# Patient Record
Sex: Male | Born: 1990 | Race: Black or African American | Hispanic: No | Marital: Single | Smoking: Current every day smoker
Health system: Southern US, Community
[De-identification: ages and names within clinical notes are randomized; demographics above are authoritative.]

## PROBLEM LIST (undated history)

## (undated) HISTORY — PX: HERNIA REPAIR: SHX51

---

## 2011-04-09 ENCOUNTER — Encounter (HOSPITAL_COMMUNITY): Payer: Self-pay | Admitting: Emergency Medicine

## 2011-04-09 ENCOUNTER — Emergency Department (HOSPITAL_COMMUNITY)
Admission: EM | Admit: 2011-04-09 | Discharge: 2011-04-09 | Disposition: A | Discharge status: Home / Self Care | Payer: Self-pay | Attending: Emergency Medicine | Admitting: Emergency Medicine

## 2011-04-09 DIAGNOSIS — G8918 Other acute postprocedural pain: Secondary | ICD-10-CM | POA: Insufficient documentation

## 2011-04-09 MED ORDER — OXYCODONE-ACETAMINOPHEN 5-325 MG PO TABS: 1.0000 | ORAL_TABLET | Freq: Four times a day (QID) | ORAL | Status: AC | PRN

## 2011-04-09 NOTE — ED Provider Notes (Signed)
History     CSN: [id number]  Arrival date & time 04/09/11  1337   First MD Initiated Contact with Patient 04/09/11 1502      Chief Complaint  Patient presents with  . Abdominal Pain   patient states he had [facility], Dr. Doylene Canning?  He reports intermittent, persistent pain and bulging in his right inguinal area. He has had no fevers, no nausea or vomiting. The pain is intermittent. He's had no testicular pain or swelling. Patient denies any significant pain at this time. He has had no back pain. No perineal pain. No difficulty with bowels or bladder. Patient states he does have a Careers adviser number, but did not contact them.  (Consider location/radiation/quality/duration/timing/severity/associated sxs/prior treatment) HPI  History reviewed. No pertinent past medical history.  Past Surgical History  Procedure Date  . Hernia repair     No family history on file.  History  Substance Use Topics  . Smoking status: Never Smoker   . Smokeless tobacco: Not on file  . Alcohol Use: No      Review of Systems  All other systems reviewed and are negative.   Patient is seen and examined, initial history and physical is completed. Evaluation initiated Allergies  Review of patient's allergies indicates no known allergies.  Home Medications  No current outpatient prescriptions on file.  BP 125/78  Pulse 79  Temp(Src) 98.4 F (36.9 C) (Oral)  Resp 20  SpO2 100%  Physical Exam  Nursing note and vitals reviewed. Constitutional: He is oriented to person, place, and time. He appears well-developed and well-nourished.  HENT:  Head: Normocephalic and atraumatic.  Eyes: Conjunctivae and EOM are normal. Pupils are equal, round, and reactive to light.  Neck: Neck supple.  Cardiovascular: Normal rate and regular rhythm.  Exam reveals no gallop and no friction rub.   No murmur heard. Pulmonary/Chest: Breath sounds normal. He has no  wheezes. He has no rales. He exhibits no tenderness.  Abdominal: Soft. Bowel sounds are normal. He exhibits no distension. There is no tenderness. There is no rebound and no guarding.  Genitourinary:       Right inguinal area is examined. Scar is intact. There is no significant bulging, no deformity, minimal tenderness to palpation over the scar. No redness. When patient stood up and bear down and there was no increase in the swelling. No signs of incarceration.  Musculoskeletal: Normal range of motion.  Neurological: He is alert and oriented to person, place, and time. No cranial nerve deficit. Coordination normal.  Skin: Skin is warm and dry. No rash noted.  Psychiatric: He has a normal mood and affect.    ED Course  Procedures (including critical care time)  Labs Reviewed - No data to display No results found.   No diagnosis found.   Patient is seen and examined, initial history and physical is completed. Evaluation initiated MDM  Plan: The patient's pain is more consistent with postoperative pain. There are no signs of acute strangulation at this time. Vital signs are normal. Abdomen is completely benign. No other systemic symptoms. Patient was told that he would need to contact his general surgeon to discuss further matters as an outpatient. He is also referred to our general surgeons here in [place]. We'll provide pain medications and instructions for followup.   He was told to return to the ER for any increasing pain, fevers, vomiting, testicular pain or any other concerning symptoms. Otherwise, will be stable for  outpatient followup        Shandrea Lusk A. Patrica Duel, MD 04/09/11 1515

## 2011-04-09 NOTE — ED Notes (Signed)
Dec 2011 hernia surgery states having been having intermittent pain since the surgery.  Concerned about pain and currently 8-9/10 achy dull sharp. Denies nausea vomiting or diarrhea.

## 2011-04-09 NOTE — Discharge Instructions (Signed)
Hernia A hernia happens when an organ inside your body pushes out through a weak spot in your belly (abdominal) wall. Most hernias get worse over time. They can often be pushed back into place (reduced). Surgery may be needed to repair hernias that cannot be pushed into place. HOME CARE  Keep doing normal activities.   Avoid lifting more than 10 pounds (4.5 kilograms).   Cough gently and avoid straining. Over time, these things will:   Increase your hernia size.   Irritate your hernia.   Break down hernia repairs.   Stop smoking.   Do not wear anything tight over your hernia. Do not keep the hernia in with an outside bandage.   Eat food that is high in fiber (fruit, vegetables, whole grains).   Drink enough fluids to keep your pee (urine) clear or pale yellow.   Take medicines to make your poop soft (stool softeners) if you cannot poop (constipated).  GET HELP RIGHT AWAY IF:   You have a fever.   You have belly pain that gets worse.   You feel sick to your stomach (nauseous) and throw up (vomit).   Your skin starts to bulge out.   Your hernia turns a different color, feels hard, or is tender.   You have increased pain or puffiness (swelling) around the hernia.   You poop more or less often.   Your poop does not look the way normally does.   You have watery poop (diarrhea).   You cannot push the hernia back in place by applying gentle pressure while lying down.  MAKE SURE YOU:   Understand these instructions.   Will watch your condition.   Will get help right away if you are not doing well or get worse.  Document Released: 06/24/2009 Document Revised: 12/24/2010 Document Reviewed: 06/24/2009 ExitCare Patient Information 2012 [employer]. 

## 2011-07-02 ENCOUNTER — Emergency Department (HOSPITAL_COMMUNITY)
Admission: EM | Admit: 2011-07-02 | Discharge: 2011-07-02 | Disposition: A | Discharge status: Home / Self Care | Payer: Self-pay | Attending: Emergency Medicine | Admitting: Emergency Medicine

## 2011-07-02 ENCOUNTER — Encounter (HOSPITAL_COMMUNITY): Payer: Self-pay | Admitting: Emergency Medicine

## 2011-07-02 DIAGNOSIS — R300 Dysuria: Secondary | ICD-10-CM | POA: Insufficient documentation

## 2011-07-02 DIAGNOSIS — F17200 Nicotine dependence, unspecified, uncomplicated: Secondary | ICD-10-CM | POA: Insufficient documentation

## 2011-07-02 LAB — URINALYSIS, ROUTINE W REFLEX MICROSCOPIC
Bilirubin Urine: NEGATIVE
Hgb urine dipstick: NEGATIVE
Ketones, ur: NEGATIVE mg/dL
Nitrite: NEGATIVE
Specific Gravity, Urine: 1.025 (ref 1.005–1.030)
Urobilinogen, UA: 1 mg/dL (ref 0.0–1.0)
pH: 6.5 (ref 5.0–8.0)

## 2011-07-02 MED ORDER — LIDOCAINE HCL (PF) 1 % IJ SOLN
INTRAMUSCULAR | Status: AC
  Filled 2011-07-02: qty 5

## 2011-07-02 MED ORDER — CEFTRIAXONE SODIUM 250 MG IJ SOLR
250.0000 mg | INTRAMUSCULAR | Status: DC
  Administered 2011-07-02: 250 mg via INTRAMUSCULAR
  Filled 2011-07-02: qty 250

## 2011-07-02 MED ORDER — AZITHROMYCIN 250 MG PO TABS
1000.0000 mg | ORAL_TABLET | Freq: Once | ORAL | Status: AC
  Administered 2011-07-02: 1000 mg via ORAL
  Filled 2011-07-02: qty 4

## 2011-07-02 NOTE — ED Notes (Signed)
Pt c/o burning with urination x 2 days since taking supplement; pt denies penile discharge but just sts burning; pt poor historian

## 2011-07-02 NOTE — ED Provider Notes (Signed)
Medical screening examination/treatment/procedure(s) were performed by non-physician practitioner and as supervising physician I was immediately available for consultation/collaboration.   Loren Racer, MD 07/02/11 [phone]

## 2011-07-02 NOTE — ED Provider Notes (Signed)
History     CSN: [id number]  Arrival date & time 07/02/11  0740   First MD Initiated Contact with Patient 07/02/11 [phone]      Chief Complaint  Patient presents with  . Dysuria    (Consider location/radiation/quality/duration/timing/severity/associated sxs/prior treatment) HPI Comments: Patient presents with history of 2 days of dysuria. Symptoms coincided with the patient beginning to take an exercise supplement called Jack3D. Patient denies urethral discharge, fever, nausea vomiting. Patient states she essentially active and was last checked approximately 3 weeks ago. He states that he uses protection. Patient also took a pill that a friend gave him for urinary tract infection. He noted that it changed the color of his urine. Nothing makes the symptoms better. Onset was acute. Course is intermittent with urination.   Patient is a 21 y.o. male presenting with dysuria. The history is provided by the patient.  Dysuria  This is a new problem. The current episode started 2 days ago. The problem occurs every urination. The problem has not changed since onset.The quality of the pain is described as burning. The pain is mild. There has been no fever. Pertinent negatives include no nausea, no vomiting, no discharge, no frequency, no hematuria, no hesitancy, no urgency and no flank pain. Treatments tried: pill that turned urine orange.    History reviewed. No pertinent past medical history.  Past Surgical History  Procedure Date  . Hernia repair     History reviewed. No pertinent family history.  History  Substance Use Topics  . Smoking status: Current Everyday Smoker  . Smokeless tobacco: Not on file  . Alcohol Use: Yes      Review of Systems  Constitutional: Negative for fever.  HENT: Negative for sore throat.   Eyes: Negative for discharge and redness.  Gastrointestinal: Negative for nausea, vomiting, abdominal pain, diarrhea and rectal pain.  Genitourinary: Positive for  dysuria. Negative for hesitancy, urgency, frequency, hematuria, flank pain, discharge, genital sores, penile pain and testicular pain.  Musculoskeletal: Negative for myalgias and arthralgias.  Skin: Negative for rash.  Neurological: Negative for headaches.  Hematological: Negative for adenopathy.    Allergies  Review of patient's allergies indicates no known allergies.  Home Medications   Current Outpatient Rx  Name Route Sig Dispense Refill  . OVER THE COUNTER MEDICATION Oral Take 1 each by mouth daily. Ree Kida 3d pre-exercise supplement      BP 148/81  Pulse 99  Temp 98.2 F (36.8 C) (Oral)  Resp 16  SpO2 99%  Physical Exam  Nursing note and vitals reviewed. Constitutional: He appears well-developed and well-nourished.  HENT:  Head: Normocephalic and atraumatic.  Eyes: Conjunctivae are normal.  Neck: Normal range of motion. Neck supple.  Pulmonary/Chest: No respiratory distress.  Genitourinary: Testes normal. Right testis shows no mass and no tenderness. Left testis shows no mass and no tenderness. Circumcised. No penile tenderness. No discharge found.  Lymphadenopathy:       Right: No inguinal adenopathy present.       Left: No inguinal adenopathy present.  Neurological: He is alert.  Skin: Skin is warm and dry.  Psychiatric: He has a normal mood and affect.    ED Course  Procedures (including critical care time)   Labs Reviewed  URINALYSIS, ROUTINE W REFLEX MICROSCOPIC  GC/CHLAMYDIA PROBE AMP, URINE   No results found.   1. Dysuria     8:16 AM Patient seen and examined. Work-up initiated. Medications ordered.   Vital signs reviewed and are as follows: Filed  Vitals:   07/02/11 0743  BP: 148/81  Pulse: 99  Temp: 98.2 F (36.8 C)  Resp: 16   Awaiting UA. Patient agrees to be treated for [facility] and Chlamydia today. He was told that he will be notified if he has a positive test for these. Patient will discontinue supplement.  8:46 AM UA neg.   Patient  counseled on safe sexual practices.  Told them that they should not have sexual contact for next 7 days and that they need to inform sexual partners so that they can get tested and treated as well.  Urged f/u with [place] [facility] for HIV and syphllis testing.  Patient verbalizes understanding and agrees with plan.    MDM  Dysuria. Will d/c supplement to see if this is cause. Online research does not reveal any obvious link between it and dysuria. Also treated for GC/chlamydia. No systemic symptoms. Patient appears well.         Renne Crigler, [place] 07/02/11 [phone]

## 2011-07-02 NOTE — Discharge Instructions (Signed)
Please read and follow all provided instructions.  Your diagnoses today include:  1. Dysuria     Tests performed today include:  Urine test - does not show infection  GC/chlamydia test - you will be notified if this is positive  Vital signs. See below for your results today.   Medications prescribed:   None  Home care instructions:  Follow any educational materials contained in this packet.  Do not have sex for 7 days to allow the medication time to work. Follow-up with [place] for HIV and syphilis testing.   Stop taking exercise supplement.   Follow-up instructions: Please follow-up with your primary care provider in the next 3 days for further evaluation of your symptoms if not improved. If you do not have a primary care doctor -- see below for referral information.   Return instructions:   Please return to the Emergency Department if you experience worsening symptoms.   Please return if you have any other emergent concerns.  Additional Information:  Your vital signs today were: BP 148/81  Pulse 99  Temp 98.2 F (36.8 C) (Oral)  Resp 16  SpO2 99% If your blood pressure (BP) was elevated above 135/85 this visit, please have this repeated by your doctor within one month. -------------- No Primary Care Doctor Call [facility]  [phone] Other agencies that provide inexpensive medical care    [facility]  [phone]    [facility]  [phone]    [facility]  [phone]    [facility]  [phone]    [facility]  [phone]    [facility]  [phone] -------------- RESOURCE GUIDE:  Dental Problems  Patients with Medicaid: [facility] [phone]                                            [id number] Phone:  [phone]                                                   Phone:  [phone]  If unable to pay or uninsured,  contact:  [facility] or [facility]. to become qualified for the adult dental clinic.  Chronic Pain Problems Contact [facility]  [phone] Patients need to be referred by their primary care doctor.  Insufficient Money for Medicine Contact [employer]:  call "211" or [facility] [phone].  Psychological Services [facility]  [phone] [facility]  [phone] [facility]   [phone] (emergency services [phone])  Substance Abuse Resources [facility]  [phone] [facility] [phone] [facility] [place] [phone] Floydene Flock [phone] Residential & Outpatient Substance Abuse Program  [phone]  Abuse/Neglect [facility] Child Abuse Hotline [phone])618-1469 [facility] Child Abuse Hotline 609-460-2412 (After Hours)  Emergency Shelter [facility] [phone]  Maternity Homes Room at [facility] [place] [facility] [place] [phone] [facility] [phone]  [facility] [place]  [facility] [facility] [address] [place]                       [address]      [address]  [place]                                                [place]                            [place] Phone:  [phone]                                   Phone:  [phone]                 Phone:  [phone]  [place] [facility] Phone:  [phone]  [place] Child Abuse Hotline (336) 342-1394 [phone]) [phone] (After Hours)    

## 2011-07-05 LAB — GC/CHLAMYDIA PROBE AMP, URINE: Chlamydia, Swab/Urine, PCR: NEGATIVE

## 2012-02-13 ENCOUNTER — Emergency Department (HOSPITAL_COMMUNITY)
Admission: EM | Admit: 2012-02-13 | Discharge: 2012-02-14 | Disposition: A | Discharge status: Home / Self Care | Payer: Self-pay | Attending: Emergency Medicine | Admitting: Emergency Medicine

## 2012-02-13 ENCOUNTER — Encounter (HOSPITAL_COMMUNITY): Payer: Self-pay | Admitting: *Deleted

## 2012-02-13 DIAGNOSIS — B081 Molluscum contagiosum: Secondary | ICD-10-CM | POA: Insufficient documentation

## 2012-02-13 DIAGNOSIS — F17200 Nicotine dependence, unspecified, uncomplicated: Secondary | ICD-10-CM | POA: Insufficient documentation

## 2012-02-13 DIAGNOSIS — B86 Scabies: Secondary | ICD-10-CM | POA: Insufficient documentation

## 2012-02-13 DIAGNOSIS — L299 Pruritus, unspecified: Secondary | ICD-10-CM | POA: Insufficient documentation

## 2012-02-13 NOTE — ED Notes (Addendum)
C/o itching, breaking out, rash. Sx r/t sleeping on particular couch & bed.  Multiple friends with same sx who have slept in same bed/couch. Ongoing for 3 weeks. Pinpoints itching and break out to b/w fingers, arms, abd & groin.

## 2012-02-14 MED ORDER — PERMETHRIN 5 % EX CREA: TOPICAL_CREAM | Freq: Once | CUTANEOUS | Status: DC

## 2012-02-14 NOTE — ED Notes (Signed)
EDPA into room, chaperone present.

## 2012-02-14 NOTE — ED Notes (Signed)
Updated about wait, process & plan with rationale.

## 2012-02-14 NOTE — ED Notes (Signed)
Pt called x 2 and no answer

## 2012-02-14 NOTE — ED Notes (Signed)
No changes. Denies questions. Given Rx x1. Out to d/c desk.

## 2012-02-14 NOTE — ED Provider Notes (Signed)
History     CSN: [id number]  Arrival date & time 02/13/12  2301   First MD Initiated Contact with Patient 02/13/12 2357      Chief Complaint  Patient presents with  . Rash  . Pruritis    (Consider location/radiation/quality/duration/timing/severity/associated sxs/prior treatment) HPI History provided by pt.  Pt has had a diffuse pruritic rash, most prominent on dorsal surface of hands, waistline and upper thighs, for the past three weeks.  Has two friends that have slept on the same couch as him, that have similar sx.  No associated fever or pain.  Has not taken anything for sx.  Also c/o mildly pruritic rash of penis, different in appearance, that started around the same time.  Denies urethral discharge, dysuria and testicular pain.   History reviewed. No pertinent past medical history.  Past Surgical History  Procedure Date  . Hernia repair     History reviewed. No pertinent family history.  History  Substance Use Topics  . Smoking status: Current Every Day Smoker  . Smokeless tobacco: Not on file  . Alcohol Use: Yes      Review of Systems  All other systems reviewed and are negative.    Allergies  Review of patient's allergies indicates no known allergies.  Home Medications   Current Outpatient Rx  Name  Route  Sig  Dispense  Refill  . PERMETHRIN 5 % EX CREA   Topical   Apply topically once.   60 g   0     BP 133/77  Temp 98.6 F (37 C) (Oral)  Resp 16  SpO2 97%  Physical Exam  Nursing note and vitals reviewed. Constitutional: He is oriented to person, place, and time. He appears well-developed and well-nourished. No distress.  HENT:  Head: Normocephalic and atraumatic.  Eyes:       Normal appearance  Neck: Normal range of motion.  Pulmonary/Chest: Effort normal.  Genitourinary:       Several, discrete, skin-colored, papular lesions on shaft of penis.  Non-tender. No penile discharge.  Testicles descended bilaterally.  No masses.  No  tenderness.     Musculoskeletal: Normal range of motion.  Neurological: He is alert and oriented to person, place, and time.  Skin:       Several, slightly raised, erythematous/hyperpigmented lesions, <0.5cm in diameter, some of which are scabbed, on dorsal surface of hands, particular at webs between fingers, on lower abdomen and waistline and upper inner thighs.  Excoriations from scratching.   Psychiatric: He has a normal mood and affect. His behavior is normal.    ED Course  Procedures (including critical care time)  Labs Reviewed - No data to display No results found.   1. Scabies   2. Molluscum contagiosum infection       MDM  22yo M presents w/ rash, most consistent w/ scabies, as well as molluscum contagiosum of genitalia.  Prescribed permethrin cream, recommended benadryl, cool compresses and avoidance of scratching, and referred to dermatology for persistent/worsening genitalia rash.  Return precautions discussed.         Otilio Miu, PA-C 02/14/12 [phone]

## 2012-02-14 NOTE — ED Provider Notes (Signed)
Medical screening examination/treatment/procedure(s) were performed by non-physician practitioner and as supervising physician I was immediately available for consultation/collaboration.  Jones Skene, M.D.     Jones Skene, MD 02/14/12 [phone]

## 2012-05-01 ENCOUNTER — Emergency Department (HOSPITAL_COMMUNITY)
Admission: EM | Admit: 2012-05-01 | Discharge: 2012-05-01 | Disposition: A | Discharge status: Home / Self Care | Payer: Self-pay | Attending: Emergency Medicine | Admitting: Emergency Medicine

## 2012-05-01 ENCOUNTER — Emergency Department (HOSPITAL_COMMUNITY): Payer: Self-pay

## 2012-05-01 ENCOUNTER — Encounter (HOSPITAL_COMMUNITY): Payer: Self-pay

## 2012-05-01 DIAGNOSIS — J069 Acute upper respiratory infection, unspecified: Secondary | ICD-10-CM | POA: Insufficient documentation

## 2012-05-01 DIAGNOSIS — R0602 Shortness of breath: Secondary | ICD-10-CM | POA: Insufficient documentation

## 2012-05-01 DIAGNOSIS — J029 Acute pharyngitis, unspecified: Secondary | ICD-10-CM | POA: Insufficient documentation

## 2012-05-01 DIAGNOSIS — R05 Cough: Secondary | ICD-10-CM

## 2012-05-01 DIAGNOSIS — R1110 Vomiting, unspecified: Secondary | ICD-10-CM | POA: Insufficient documentation

## 2012-05-01 MED ORDER — BENZONATATE 100 MG PO CAPS
100.0000 mg | ORAL_CAPSULE | Freq: Two times a day (BID) | ORAL | Status: DC | PRN
Start: 2012-05-01 — End: 2013-04-20

## 2012-05-01 NOTE — ED Provider Notes (Signed)
History     CSN: [id number]  Arrival date & time 05/01/12  6578   First MD Initiated Contact with Patient 05/01/12 [phone]      Chief Complaint  Patient presents with  . URI    (Consider location/radiation/quality/duration/timing/severity/associated sxs/prior treatment) HPI Comments: Patient is a 22 y/o M with no significant PMHx presenting to the ED with cough x 5 days. Patient reported that the cough is constant with spells that cause the patient to gag and feel like he is going to vomit - had one episode of emesis this morning of food contents - denied any recent episode of emesis. Reported that he was coughing up mucus, but now many dry cough. Patient reported that when he coughs he does find it difficulty to breathe and that he has to gasp for air during his coughing spells. Stated that he has tried Mucinex with little relief. Patient reported sore throat, stating that his throat feels dry, denied pain with swallowing. Patient is a frequent smoker, smokes approximately 1/2 ppd and has been smoking through this event - as per patient. Denied chest pain, facial pressure, eye pain, ear pain, eye tearing/swelling/inflammation/discharge, hemoptysis, rhinorrhea, fever, headache, dizziness, changes to eating habits, gi symptoms, urinary symptoms.   Patient is a 22 y.o. male presenting with URI. The history is provided by the patient. No language interpreter was used.  URI Presenting symptoms: cough and sore throat   Presenting symptoms: no congestion, no ear pain, no fever and no rhinorrhea   Associated symptoms: no headaches and no neck pain     No past medical history on file.  Past Surgical History  Procedure Laterality Date  . Hernia repair      Family History  Problem Relation Age of Onset  . Hypothyroidism Mother     History  Substance Use Topics  . Smoking status: Current Every Day Smoker -- 0.50 packs/day for 2 years  . Smokeless tobacco: Not on file  . Alcohol Use: 1.2  oz/week    2 Cans of beer per week      Review of Systems  Constitutional: Negative for fever and chills.  HENT: Positive for sore throat. Negative for ear pain, congestion, rhinorrhea, trouble swallowing, neck pain, neck stiffness and tinnitus.   Eyes: Negative for pain, discharge and visual disturbance.  Respiratory: Positive for cough and shortness of breath. Negative for chest tightness.   Cardiovascular: Negative for chest pain.  Gastrointestinal: Negative for nausea, vomiting, abdominal pain and diarrhea.  Genitourinary: Negative for dysuria, hematuria and difficulty urinating.  Musculoskeletal: Negative for back pain.  Neurological: Negative for dizziness and headaches.  All other systems reviewed and are negative.    Allergies  Review of patient's allergies indicates no known allergies.  Home Medications   Current Outpatient Rx  Name  Route  Sig  Dispense  Refill  . guaiFENesin (MUCINEX) 600 MG 12 hr tablet   Oral   Take 1,200 mg by mouth 2 (two) times daily as needed for congestion.         . benzonatate (TESSALON) 100 MG capsule   Oral   Take 1 capsule (100 mg total) by mouth 2 (two) times daily as needed for cough.   21 capsule   0     BP 136/81  Pulse 72  Temp(Src) 98.2 F (36.8 C) (Oral)  Resp 16  Ht 6\' 2"  (1.88 m)  Wt 160 lb (72.576 kg)  BMI 20.53 kg/m2  SpO2 99%  Physical Exam  Nursing  note and vitals reviewed. Constitutional: He is oriented to person, place, and time. He appears well-developed and well-nourished. No distress.  HENT:  Head: Normocephalic and atraumatic.  Right Ear: External ear normal.  Left Ear: External ear normal.  Mouth/Throat: Oropharynx is clear and moist. No oropharyngeal exudate.  Eyes: Conjunctivae and EOM are normal. Pupils are equal, round, and reactive to light. Right eye exhibits no discharge. Left eye exhibits no discharge.  Neck: Normal range of motion. Neck supple. No tracheal deviation present. No thyromegaly  present.  Negative lymphadenopathy  Cardiovascular: Normal rate, regular rhythm, normal heart sounds and intact distal pulses.  Exam reveals no friction rub.   No murmur heard. Pulmonary/Chest: Effort normal and breath sounds normal. No respiratory distress. He has no wheezes. He has no rales. He exhibits no tenderness.  Abdominal: Soft. Bowel sounds are normal. He exhibits no distension. There is no tenderness. There is no rebound and no guarding.  Lymphadenopathy:    He has no cervical adenopathy.  Neurological: He is alert and oriented to person, place, and time. He has normal reflexes. No cranial nerve deficit. He exhibits normal muscle tone. Coordination normal.  Skin: Skin is warm and dry. No rash noted. He is not diaphoretic. No erythema.  Psychiatric: He has a normal mood and affect. His behavior is normal. Thought content normal.    ED Course  Procedures (including critical care time)  Labs Reviewed - No data to display Dg Chest 2 View  05/01/2012  *RADIOLOGY REPORT*  Clinical Data: Shortness of breath, cough  CHEST - 2 VIEW  Comparison: None.  Findings: No active infiltrate or effusion is seen.  Mediastinal contours appear normal.  The heart is within normal limits in size. No bony abnormality is seen.  IMPRESSION: No active lung disease.   Original Report Authenticated By: Dwyane Dee, M.D.      1. URI (upper respiratory infection)   2. Cough       MDM  Patient is afebrile, normotensive, non-tachycardic, alert and oriented. Chest x-ray negative findings - ruling out pneumonia. Patient aseptic, non-toxic appearing, cooperative. Discharged patient. Discharged patient with Tessalon for cough relief - discussed with patient on how to take medication. Discussed with patient to follow-up with Urgent Care Center for re-check. Discussed with patient to stay hydrated and to use OTC lozenges for dry throat. Discussed with patient the need to stop smoking - educated patient on the  consequences. Discussed with patient that if symptoms change or worsen to please report back to the ED. Patient agreed to plan of care, understood, all questions answered.         Raymon Mutton, PA-C 05/01/12 1656

## 2012-05-01 NOTE — ED Notes (Signed)
The patient complains of URI symptoms since 04/27/12.  He states he has bad coughing bouts and a sore throat.

## 2012-05-01 NOTE — ED Provider Notes (Signed)
Medical screening examination/treatment/procedure(s) were performed by non-physician practitioner and as supervising physician I was immediately available for consultation/collaboration.  Olivia Mackie, MD 05/01/12 [phone]

## 2013-04-20 ENCOUNTER — Emergency Department (HOSPITAL_COMMUNITY)
Admission: EM | Admit: 2013-04-20 | Discharge: 2013-04-20 | Disposition: A | Discharge status: Home / Self Care | Payer: BC Managed Care – PPO | Attending: Emergency Medicine | Admitting: Emergency Medicine

## 2013-04-20 ENCOUNTER — Encounter (HOSPITAL_COMMUNITY): Payer: Self-pay | Admitting: Emergency Medicine

## 2013-04-20 ENCOUNTER — Emergency Department (HOSPITAL_COMMUNITY): Payer: BC Managed Care – PPO

## 2013-04-20 DIAGNOSIS — Y939 Activity, unspecified: Secondary | ICD-10-CM | POA: Insufficient documentation

## 2013-04-20 DIAGNOSIS — R269 Unspecified abnormalities of gait and mobility: Secondary | ICD-10-CM | POA: Insufficient documentation

## 2013-04-20 DIAGNOSIS — IMO0002 Reserved for concepts with insufficient information to code with codable children: Secondary | ICD-10-CM | POA: Insufficient documentation

## 2013-04-20 DIAGNOSIS — S9780XA Crushing injury of unspecified foot, initial encounter: Secondary | ICD-10-CM | POA: Insufficient documentation

## 2013-04-20 DIAGNOSIS — F17200 Nicotine dependence, unspecified, uncomplicated: Secondary | ICD-10-CM | POA: Insufficient documentation

## 2013-04-20 DIAGNOSIS — Y92410 Unspecified street and highway as the place of occurrence of the external cause: Secondary | ICD-10-CM | POA: Insufficient documentation

## 2013-04-20 MED ORDER — IBUPROFEN 800 MG PO TABS: 800.0000 mg | ORAL_TABLET | Freq: Three times a day (TID) | ORAL | Status: DC
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
## 2013-04-20 NOTE — ED Provider Notes (Signed)
MSE was initiated and I personally evaluated the patient and placed orders (if any) at  5:38 AM on April 20, 2013.  The patient appears stable so that the remainder of the MSE may be completed by another provider.  Darlys Galesavid Masneri, MD 04/20/13 [phone]

## 2013-04-20 NOTE — ED Notes (Signed)
MD at bedside. 

## 2013-04-20 NOTE — ED Notes (Signed)
PA at BS.  

## 2013-04-20 NOTE — Discharge Instructions (Signed)
Call Dr. Lajoyce Cornersuda for a follow up appointment. Call for a follow up appointment with a Family or Primary Care Provider.  Return to the Emergency Department if Symptoms worsen, you have an increase in pain, you have an increase in swelling, discoloration of the foot, numbness or weakness in the foot or toes. Take medication as prescribed.  Ice your foot 3-4 times a day. Elevate your foot when you are not standing or walking. Take Ibuprofen for the discomfort and swelling.   Emergency Department Resource Guide 1) Find a Doctor and Pay Out of Pocket Although you won't have to find out who is covered by your insurance plan, it is a good idea to ask around and get recommendations. You will then need to call the office and see if the doctor you have chosen will accept you as a new patient and what types of options they offer for patients who are self-pay. Some doctors offer discounts or will set up payment plans for their patients who do not have insurance, but you will need to ask so you aren't surprised when you get to your appointment.  2) Contact Your Local Health Department Not all health departments have doctors that can see patients for sick visits, but many do, so it is worth a call to see if yours does. If you don't know where your local health department is, you can check in your phone book. The CDC also has a tool to help you locate your state's health department, and many state websites also have listings of all of their local health departments.  3) Find a Walk-in Clinic If your illness is not likely to be very severe or complicated, you may want to try a walk in clinic. These are popping up all over the country in pharmacies, drugstores, and shopping centers. They're usually staffed by nurse practitioners or physician assistants that have been trained to treat common illnesses and complaints. They're usually fairly quick and inexpensive. However, if you have serious medical issues or chronic  medical problems, these are probably not your best option.  No Primary Care Doctor: - Call Health Connect at  [phone] - they can help you locate a primary care doctor that  accepts your insurance, provides certain services, etc. - Physician Referral Service- [phone]  Chronic Pain Problems: Organization         Address  Phone   Notes  [facility]  [phone] Patients need to be referred by their primary care doctor.   Medication Assistance: Organization         Address  Phone   Notes  [facility] Medication [facility] [address] [place], [place] [address] [phone] --Must be a resident of [place] -- Must have NO insurance coverage whatsoever (no Medicaid/ Medicare, etc.) -- The pt. MUST have a primary care doctor that directs their care regularly and follows them in the community   MedAssist  [phone]) [phone]   [employer]  [phone]) [phone]    Agencies that provide inexpensive medical care: Organization         Address  Phone   Notes  [facility]  [phone]   [facility]    [phone]   [facility] [address] [place], [place] [address] [phone]   [facility] [address], [place] [phone]   Planned Parenthood    [phone](336) [phone]   [facility]    [phone]   Community Health and [facility]  [address], [place] Phone:  [phone], Fax:  [phone] Hours of Operation:  9 am - 6 pm, M-F.  Also accepts Medicaid/Medicare and self-pay.  [facility] for [place], [address], [place] Phone: [phone], Fax: [phone]. Hours of Operation:  8:30 am - 5:30 pm, M-F.  Also accepts Medicaid and self-pay.  [facility] [address] [place] Phone: [phone]   [place], [place], [place] [phone],  Ext. [phone] Mondays & Thursdays: 7-9 AM.  First 15 patients are seen on a first come, first serve basis.    [place] Providers:  Organization         Address  Phone   Notes  [facility] [address], [place] [phone] Also accepts self-pay patients.  [facility] [id number] [place], [place]  [phone]   [place], [address], [place] [phone]   [facility] [address], [place] [phone]   Lucianne Lei [address], [place]   [phone] Only accepts [place] Access [place] patients after they have their name applied to their card.   Self-Pay (no insurance) in [facility]:  Organization         Address  Phone   Notes  Sickle Cell Patients, [facility] [place] [phone]   [facility] [place] [phone]   [facility] [place]  [place], [place], [place] [phone]   [facility]/Dr. Osei-Bonsu  [address] [place] or [address], [place] [phone] Phone number for both [place] and [place] locations is the same.  [facility]gent [facility] and [facility] [address] [place] [phone]   [facility] [address] [place] or [address] [phone]   [facility] [address] [place] [phone], phone; [phone], fax Sees patients 1st and 3rd Saturday of every month.  Must not qualify for public or private insurance (i.e. Medicaid, Medicare, [place] Health Choice, Veterans' Benefits)  Household income should be no more than 200% of the poverty level The clinic cannot treat you if you are pregnant or think you are pregnant  Sexually transmitted diseases are not  treated at the clinic.    Dental Care: Organization         Address  Phone  Notes  [facility] Department of [facility] [phone] Accepts children up to age 79 who are enrolled in [place] or [place]; pregnant women with a Medicaid card; and children who have applied for Medicaid or [place] Health Choice, but were declined, whose parents can pay a reduced fee at time of service.  [facility] Department of [facility]  [address], [place] [phone] Accepts children up to age 15 who are enrolled in [place] or [place]; pregnant women with a Medicaid card; and children who have applied for Medicaid or [place] Health Choice, but were declined, whose parents can pay a reduced fee at time of  service.  [place] Adult [facility] Access PROGRAM  [place] [phone] Patients are seen by appointment only. Walk-ins are not accepted. [place] will see patients 11 years of age and older. Monday - Tuesday (8am-5pm) Most Wednesdays (8:30-5pm) $30 per visit, cash only  [facility] Adult Dental Access PROGRAM  [address], [facility] [phone] Patients are seen by appointment only. Walk-ins are not accepted. [place] will see patients 64 years of age and older. One Wednesday Evening (Monthly: Volunteer Based).  $30 per visit, cash only  [place]  [phone] for adults; Children under age 38, call Graduate Pediatric Dentistry at [phone]. Children aged 51-14, please call [phone] to request a pediatric application.  Dental services are provided in all areas of dental care including fillings, crowns and bridges, complete and partial dentures, implants, gum treatment, root canals, and extractions. Preventive care is also provided. Treatment is provided to both adults and children. Patients are selected via a lottery and there is  often a waiting list.   [facility] [address], [place]  [phone] [email or web address]   [facility] [address] [place], [place] [phone], Ext. [phone] Second and Fourth Thursday of each month, opens at 6:30 AM; Clinic ends at 9 AM.  Patients are seen on a first-come first-served basis, and a limited number are seen during each clinic.   [facility]  [address] Hillard Danker [place], [place] [phone]   Eligibility Requirements You must have lived in [place], [place], or [place] counties for at least the last three months.   You cannot be eligible for state or federal sponsored [employer], including [employer], [place], or [employer].   You generally cannot be eligible for healthcare insurance through your employer.    How to apply: Eligibility screenings are held every Tuesday and Wednesday afternoon from 1:00 pm until 4:00 pm. You do not need an appointment for the interview!  [facility] [address] [place], [place]   [place]  [place] Department  [place]  [phone]    Behavioral Health Resources in the Community: Intensive Outpatient Programs Organization         Address  Phone  Notes  [place]. [address] [place], [place] [phone]   [facility] [address], [place], [place]   ADS: Alcohol & Drug Svcs [address] [place], [place]   [place] [address],  [place], [place] or [phone]   Substance Abuse Resources Organization         Address  Phone  Notes  Alcohol and Drug Services  [phone]   [place]  [phone]   The [place]   Chinita Pester  [phone]   Residential & Outpatient Substance  Abuse Program  [phone]   Psychological Services Organization         Address  Phone  Notes  [facility] [place]  [place]  [phone]   [place] [address] [place] [phone] or [phone]    Mobile Crisis Teams Organization         Address  Phone  Notes  [facility] Alternatives, Mobile Crisis Care Unit  [phone]   Assertive Psychotherapeutic Services  [place], [place]   [facility] [address] [place] [phone]    Self-Help/Support Groups Organization         Address  Phone             Notes  [place]. of [place] - variety of support groups  [place] Call for more information  Narcotics Anonymous (NA), [facility] [address], [employer] [place]  2 meetings at this location   Special educational needs teacher         Address  Phone  Notes  ASAP Residential Treatment [place],    [place]  [phone]   [facility]  [address], [place] 237628, [place], [place]   [place], [place] [phone] Admissions: 8am-3pm M-F  Incentives Substance [place] [address],    [place], [place] [phone]   [facility] [address] [place], [place], [place]   [facility] [address],  [place], [place]   Insight Programs - Intensive Outpatient [address]een [address], [place], [place]   [facility] ([place].,  [place], [place] [phone] or [phone]   [facility] Treatment Services (RTS) [address] [place], [place] Accepts Medicaid  [facility] [place] [address]  [place] [phone] Substance Abuse/Addiction Treatment   [facility] Organization          Address  Phone  Notes  [facility]  [phone]   Domenic Schwab, PhD [address] Arlis [place], [place]   [phone] or [phone]   [facility] [place], [place] [phone]   [facility] [address] [place], [place] [phone] Insurance/Medicaid/sponsorship through [facility] and Families [address]                                    [place], [place] [phone] [place] [address], [place] [phone]    Dr. Adele Schilder  [phone]   [facility] Dept. 1) [address], [place] 2) [place] 3)  [place] [address], [place] [phone]  [phone]   [place] [phone] or [phone] (After Hours)
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
## 2013-10-30 ENCOUNTER — Emergency Department (HOSPITAL_COMMUNITY)
Admission: EM | Admit: 2013-10-30 | Discharge: 2013-10-30 | Disposition: A | Discharge status: Home / Self Care | Payer: No Typology Code available for payment source | Attending: Emergency Medicine | Admitting: Emergency Medicine

## 2013-10-30 ENCOUNTER — Encounter (HOSPITAL_COMMUNITY): Payer: Self-pay | Admitting: Emergency Medicine

## 2013-10-30 DIAGNOSIS — L255 Unspecified contact dermatitis due to plants, except food: Secondary | ICD-10-CM | POA: Insufficient documentation

## 2013-10-30 DIAGNOSIS — Z720 Tobacco use: Secondary | ICD-10-CM | POA: Insufficient documentation

## 2013-10-30 DIAGNOSIS — W450XXA Nail entering through skin, initial encounter: Secondary | ICD-10-CM | POA: Insufficient documentation

## 2013-10-30 DIAGNOSIS — L03113 Cellulitis of right upper limb: Secondary | ICD-10-CM

## 2013-10-30 DIAGNOSIS — Z23 Encounter for immunization: Secondary | ICD-10-CM | POA: Insufficient documentation

## 2013-10-30 DIAGNOSIS — L259 Unspecified contact dermatitis, unspecified cause: Secondary | ICD-10-CM

## 2013-10-30 DIAGNOSIS — T148XXA Other injury of unspecified body region, initial encounter: Secondary | ICD-10-CM

## 2013-10-30 DIAGNOSIS — Y9289 Other specified places as the place of occurrence of the external cause: Secondary | ICD-10-CM | POA: Insufficient documentation

## 2013-10-30 DIAGNOSIS — Z791 Long term (current) use of non-steroidal anti-inflammatories (NSAID): Secondary | ICD-10-CM | POA: Insufficient documentation

## 2013-10-30 DIAGNOSIS — Y9389 Activity, other specified: Secondary | ICD-10-CM | POA: Insufficient documentation

## 2013-10-30 DIAGNOSIS — L03111 Cellulitis of right axilla: Secondary | ICD-10-CM | POA: Insufficient documentation

## 2013-10-30 DIAGNOSIS — S91301A Unspecified open wound, right foot, initial encounter: Secondary | ICD-10-CM | POA: Insufficient documentation

## 2013-10-30 MED ORDER — PREDNISONE 20 MG PO TABS: 40.0000 mg | ORAL_TABLET | Freq: Every day | ORAL | Status: AC

## 2013-10-30 MED ORDER — HYDROCORTISONE 1 % EX CREA: TOPICAL_CREAM | CUTANEOUS | Status: AC

## 2013-10-30 MED ORDER — DEXAMETHASONE SODIUM PHOSPHATE 10 MG/ML IJ SOLN
10.0000 mg | Freq: Once | INTRAMUSCULAR | Status: AC
  Administered 2013-10-30: 10 mg via INTRAMUSCULAR
  Filled 2013-10-30: qty 1

## 2013-10-30 MED ORDER — TETANUS-DIPHTH-ACELL PERTUSSIS 5-2.5-18.5 LF-MCG/0.5 IM SUSP
0.5000 mL | Freq: Once | INTRAMUSCULAR | Status: AC
  Administered 2013-10-30: 0.5 mL via INTRAMUSCULAR
  Filled 2013-10-30: qty 0.5

## 2013-10-30 MED ORDER — DIPHENHYDRAMINE HCL 25 MG PO TABS: 25.0000 mg | ORAL_TABLET | Freq: Four times a day (QID) | ORAL | Status: AC

## 2013-10-30 MED ORDER — CLINDAMYCIN HCL 150 MG PO CAPS: 450.0000 mg | ORAL_CAPSULE | Freq: Three times a day (TID) | ORAL | Status: AC

## 2013-10-30 NOTE — ED Notes (Signed)
Presents with burning rash to extremities and trunk began 2 days ago after cutting down trees. Taking benadryl and hydrocortisone cream with no relief.

## 2013-10-30 NOTE — ED Provider Notes (Signed)
CSN: [id number]     Arrival date & time 10/30/13  1616 History   This chart was scribed for non-physician practitioner Mellody DrownLauren Brek Reece, PA-C, working with No att. providers found, by Yevette EdwardsAngela Bracken, ED Scribe. This patient was seen in room TR10C/TR10C and the patient's care was started at 4:51 PM.  None    Chief Complaint  Patient presents with  . Rash    HPI Comments: Melvin Miller is a 23 y.o. 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 He characterizes the rash as itching and burning. Pt reports other co-workers that helped cut the tree down with similar rash. He has used benadryl and hydrocortisone cream without relief. He denies a rash to his genitals. He also complains of a puncture wound from a nail to the sole of his right foot which occurred three days ago.  He is unsure of his tetanus status.   The history is provided by the patient. No language interpreter was used.     History reviewed. No pertinent past medical history. Past Surgical History  Procedure Laterality Date  . Hernia repair     Family History  Problem Relation Age of Onset  . Hypothyroidism Mother    History  Substance Use Topics  . Smoking status: Current Every Day Smoker -- 0.50 packs/day for 2 years    Types: Cigarettes  . Smokeless tobacco: Not on file  . Alcohol Use: 1.2 oz/week    2 Cans of beer per week    Review of Systems  Constitutional: Negative for fever and chills.  Skin: Positive for color change, rash and wound.  All other systems reviewed and are negative.     Allergies  Review of patient's allergies indicates no known allergies.  Home Medications   Prior to Admission medications   Medication Sig Start Date End Date Taking? Authorizing Provider  ibuprofen (ADVIL,MOTRIN) 800 MG tablet Take 1 tablet (800 mg total) by mouth 3 (three) times  daily. Take with meals 04/20/13   Mellody DrownLauren Mckoy Bhakta, PA-C   Triage Vitals:  BP 131/50  Pulse 116  Temp(Src) 98.7 F (37.1 C) (Oral)  Resp 16  Ht 6\' 3"  (1.905 m)  Wt 160 lb (72.576 kg)  BMI 20.00 kg/m2  SpO2 96%  Physical Exam  Nursing note and vitals reviewed. Constitutional: He is oriented to person, place, and time. He appears well-developed and well-nourished. He is cooperative.  Non-toxic appearance. No distress.  HENT:  Head: Normocephalic and atraumatic.  Eyes: Conjunctivae and EOM are normal.  Neck: Neck supple.  Pulmonary/Chest: Effort normal. No respiratory distress.  Musculoskeletal: Normal range of motion.  Neurological: He is alert and oriented to person, place, and time.  Skin: Skin is warm and dry. Rash noted.  Single pinpoint lesion to sole of right foot. No surrounding erythema. No drainage.   Diffuse linear and grouped vesicles to bilateral upper extremities, chest, abdomen, lower extremities, and face. Erythema to Left Right extremity with associated swelling and increase in warmth.  Psychiatric: He has a normal mood and affect. His behavior is normal.    ED Course  Procedures (including critical care time)   COORDINATION OF CARE:  5:35 PM- Discussed treatment plan with patient, and the patient agreed to the plan. The plan includes prescriptions for cortisone, prednisone, and an antibiotic. Will also update pt's tetanus.   Labs Review Labs Reviewed -  No data to display  Imaging Review No results found.   EKG Interpretation None      MDM   Final diagnoses:  Cellulitis of arm, right  Contact dermatitis  Puncture wound   Patient with contact dermatitis, with secondary infection. Also complains of stepping on a nail several weeks ago, no sign of infection. Plan to treat for poison ivy, secondary infection, tetanus updated. Discussed and treatment plan with the patient. Return precautions given. Reports understanding and no other concerns at this time.   Patient is stable for discharge at this time. Meds given in ED:  Medications  dexamethasone (DECADRON) injection 10 mg (10 mg Intramuscular Given 10/30/13 1743)  Tdap (BOOSTRIX) injection 0.5 mL (0.5 mLs Intramuscular Given 10/30/13 1743)    Discharge Medication List as of 10/30/2013  5:53 PM    START taking these medications   Details  clindamycin (CLEOCIN) 150 MG capsule Take 3 capsules (450 mg total) by mouth 3 (three) times daily., Starting 10/30/2013, Until Discontinued, Print    diphenhydrAMINE (BENADRYL) 25 MG tablet Take 1 tablet (25 mg total) by mouth every 6 (six) hours., Starting 10/30/2013, Until Discontinued, Print    hydrocortisone cream 1 % Apply to affected area 2 times daily. Do not apply to your face., Print    predniSONE (DELTASONE) 20 MG tablet Take 2 tablets (40 mg total) by mouth daily. Take 40 mg by mouth daily for 3 days, then 20mg  by mouth daily for 3 days, then 10mg  daily for 3 days, Starting 10/30/2013, Until Discontinued, Print       I personally performed the services described in this documentation, which was scribed in my presence. The recorded information has been reviewed and is accurate.    Mellody DrownLauren Dayane Hillenburg, PA-C 10/31/13 [phone]

## 2013-10-30 NOTE — Discharge Instructions (Signed)
Call for a follow up appointment with a Family or Primary Care Provider.  °Return if Symptoms worsen.   °Take medication as prescribed.  °Do not scratch the lesions.  °Avoid heat and direct sunlight. ° ° ° °Emergency Department Resource Guide °1) Find a Doctor and Pay Out of Pocket °Although you won't have to find out who is covered by your insurance plan, it is a good idea to ask around and get recommendations. You will then need to call the office and see if the doctor you have chosen will accept you as a new patient and what types of options they offer for patients who are self-pay. Some doctors offer discounts or will set up payment plans for their patients who do not have insurance, but you will need to ask so you aren't surprised when you get to your appointment. ° °2) Contact Your Local Health Department °Not all health departments have doctors that can see patients for sick visits, but many do, so it is worth a call to see if yours does. If you don't know where your local health department is, you can check in your phone book. The CDC also has a tool to help you locate your state's health department, and many state websites also have listings of all of their local health departments. ° °3) Find a Walk-in Clinic °If your illness is not likely to be very severe or complicated, you may want to try a walk in clinic. These are popping up all over the country in pharmacies, drugstores, and shopping centers. They're usually staffed by nurse practitioners or physician assistants that have been trained to treat common illnesses and complaints. They're usually fairly quick and inexpensive. However, if you have serious medical issues or chronic medical problems, these are probably not your best option. ° °No Primary Care Doctor: °- Call Health Connect at  [phone] - they can help you locate a primary care doctor that  accepts your insurance, provides certain services, etc. °- Physician Referral Service-  1-800-533-3463 ° °Chronic Pain Problems: °Organization         Address  Phone   Notes  °[facility]  [phone] Patients need to be referred by their primary care doctor.  ° °Medication Assistance: °Organization         Address  Phone   Notes  °[place] [facility] [address] °[place], [place] [address] [phone]) [phone] --Must be a resident of [place] °-- Must have NO insurance coverage whatsoever (no Medicaid/ Medicare, etc.) °-- The pt. MUST have a primary care doctor that directs their care regularly and follows them in the community °  °[facility]  (866) 331-1348   °[employer]  (888) 892-1162   ° °Agencies that provide inexpensive medical care: °Organization         Address  Phone   Notes  °[facility]  ([phone]   ° Internal Medicine    ([phone]) [phone]   °[facility] [address] °[place], [place] [address] [phone]) [phone]   °[facility] [address], °[place] ([phone]) [phone]   °Planned Parenthood    ([phone]) 373-0678   °[facility]    (336) [phone]   °[facility] ° [address], [place] Phone:  ([phone]) [phone], Fax:  ([phone] Hours of Operation:  9 am - 6 pm, M-F.  Also accepts Medicaid/Medicare and self-pay.  °[facility]   [place] [facility] [place], [address], [place] Phone: [phone], Fax: [phone]. Hours of Operation:  8:30 am - 5:30 pm, M-F.  Also accepts Medicaid and self-pay.  [facility] [address] [place] Phone: [phone]   [place], [place], [place] [phone], Ext. [phone] Mondays & Thursdays: 7-9 AM.  First 15 patients are seen on a first come, first serve basis.    [place] Providers:  Organization         Address  Phone   Notes  [facility] [address],  [place] [phone] Also accepts self-pay patients.  [facility] [id number] [place], [place]  [phone]   [place], [address], [place] [phone]   [facility] [address] [place] [phone]   Lucianne Lei [address], [place]   [phone] Only accepts [place] Access [place] patients after they have their name applied to their card.   Self-Pay (no insurance) in [facility]:  Organization         Address  Phone   Notes  Sickle Cell Patients, [facility] [place] [phone]   [facility] [place] [phone]   [facility] [place]  [place], [place], [place] [phone]   [facility]/Dr. Osei-Bonsu  [address], [place] or [address], [place] [phone] Phone number for both [place] and [place] locations is the same.  [facility] [address] [place] [phone]   [facility]  [address], [place] or [address] [place] [address] [phone]   [facility] [address], [place] [phone], phone; [phone], fax Sees patients 1st and 3rd Saturday of every month.  Must not qualify for public or private insurance (i.e. Medicaid, Medicare, [place] Health Choice, Veterans' Benefits)  Household income should be no more than 200% of the poverty level The clinic cannot treat you if you are pregnant or think you are pregnant  Sexually transmitted diseases are not treated at the clinic.    Dental Care: Organization         Address  Phone  Notes  [facility] Department of [facility] [place] [phone] Accepts children up to age 40 who are enrolled in  [place] or [place]; pregnant women with a Medicaid card; and children who have applied for Medicaid or [place] Health Choice, but were declined, whose parents can pay a reduced fee at time of service.  [facility] Department of [facility]  [address], [place] [phone] Accepts children up to age 85 who are enrolled in [place] or [place]; pregnant women with a Medicaid card; and children who have applied for Medicaid or [place] Health Choice, but were declined, whose parents can pay a reduced fee at time of service.  [facility]  [place] [phone] Patients are seen by appointment only. Walk-ins are not accepted. [place] will see patients 62 years of age and older. Monday - Tuesday (8am-5pm) Most Wednesdays (8:30-5pm) $30 per visit,  cash only  [employer] [facility] [employer] PRO[facility]  [address], [place] [phone] Patients are seen by appointment only. Walk-ins are not accepted. [place] will see patients 68 years of age and older. One Wednesday Evening (Monthly: Volunteer Based).  $30 per visit, cash only  [place]  [phone] for adults; Children under age 32, call Graduate Pediatric Dentistry at [phone]. Children aged 36-14, please call [phone] to request a pediatric application.  Dental services are provided in all areas of dental care including fillings, crowns and bridges, complete and partial dentures, implants, gum treatment, root canals, and extractions. Preventive care is also provided. Treatment is provided to both adults and children. Patients are selected via a lottery and there is often a waiting list.   [facility] [address] [place]  ([phone] [email or web address]   [facility] [address] [place], [place] [phone], Ext. [phone] Second and Fourth Thursday of each month, opens at 6:30  AM; Clinic ends at 9 AM.  Patients are seen on a first-come first-served basis, and a limited number are seen during each clinic.   [facility]  [address] [place] Danker [place], [place] [phone]   Eligibility Requirements You must have lived in [place], [place], or [place] counties for at least the last three months.   You cannot be eligible for state or federal sponsored [employer], including [employer], [place], or [employer].   You generally cannot be eligible for healthcare insurance through your employer.    How to apply: Eligibility screenings are held every Tuesday and Wednesday afternoon from 1:00 pm until 4:00 pm. You do not need an appointment for the interview!  [facility] [address] [place], [place]   [place]  [place] Department  [place]  [phone]    Behavioral Health Resources in the Community: Intensive Outpatient Programs Organization         Address  Phone  Notes  Bejou Flanagan. [address], [place], [place] [phone]   [facility] Outpatient [address] [place], [place]   [facility] [address] [place], [place]   [place] [address],  [place], [place] or [phone]   Substance Abuse Resources Organization         Address  Phone  Notes  Alcohol [facility] Drug Services  8256994078   [place]  [phone]   [facility] [place]   Chinita Pester  [phone]   Residential & Outpatient Substance Abuse Program  ([phone]) [phone]   Psychological Services Organization         Address  Phone  Notes  [facility] [place]  [place]  [phone]   [place] [address] [place] or  [phone]    Mobile Crisis Teams Organization         Address  Phone  Notes  [facility], Mobile Crisis Care Unit  [phone]   [facility]  [address] [place], [place]   [place] [address], [place] [phone]    Self-Help/Support Groups Organization         Address  Phone  Notes  [facility] [place] - variety of support groups  [place] Call for more information  Narcotics Anonymous (NA), [facility] [address], [employer] [place]  2 meetings at this location   Special educational needs teacher         Address  Phone  Notes  [facility]idential Treatment [place],    [place]  1-(438) [phone]   [facility]  [address] [place] [id number], [place], [place]   [place], [place] [phone] Admissions: 8am-3pm M-F  Incentives Substance [place] [address]    [place], [place] [phone]   [facility] [address] [place], [place], [place]   [facility] [address],  [place], [place]   [facility] - Intensive Outpatient [address] Kristeen [address], [place], [place]   [facility] [address]  [place], [place] [phone] or [phone]   [facility] ([facility]) [address] [place], [place] Accepts Medicaid  [facility] [place] [address],  [place] [phone])[phone] Substance Abuse/Addiction Treatment   [facility] Organization         Address  Phone  Notes  [facility]  [phone]   Domenic Schwab, PhD [address] Arlis Porta [place], [place]   [phone] or [phone]   [place] and [place], [place] [phone]   [facility] [address],  [place], [place] [phone] Insurance/Medicaid/sponsorship through [facility] and Families [address], [address] [place]                                    [place], [place] [phone] [place] [address], [place] [phone]    Dr. Adele Schilder  [phone]   [facility] Dept. 1) [address], [place] 2) [place] 3)  [place] [address], [place] [phone]  [phone]   [place] [phone] or [phone] (After Hours)

## 2013-10-31 NOTE — ED Provider Notes (Signed)
Medical screening examination/treatment/procedure(s) were performed by non-physician practitioner and as supervising physician I was immediately available for consultation/collaboration.   EKG Interpretation None       Ellarae Nevitt L Luberta Grabinski, MD 10/31/13 0938 

## 2014-01-03 ENCOUNTER — Emergency Department (HOSPITAL_COMMUNITY)
Admission: EM | Admit: 2014-01-03 | Discharge: 2014-01-03 | Disposition: A | Discharge status: Home / Self Care | Payer: BC Managed Care – PPO | Attending: Emergency Medicine | Admitting: Emergency Medicine

## 2014-01-03 ENCOUNTER — Encounter (HOSPITAL_COMMUNITY): Payer: Self-pay | Admitting: Emergency Medicine

## 2014-01-03 ENCOUNTER — Emergency Department (HOSPITAL_COMMUNITY): Payer: BC Managed Care – PPO

## 2014-01-03 DIAGNOSIS — Z720 Tobacco use: Secondary | ICD-10-CM | POA: Insufficient documentation

## 2014-01-03 DIAGNOSIS — Z7952 Long term (current) use of systemic steroids: Secondary | ICD-10-CM | POA: Insufficient documentation

## 2014-01-03 DIAGNOSIS — Y9389 Activity, other specified: Secondary | ICD-10-CM | POA: Insufficient documentation

## 2014-01-03 DIAGNOSIS — Z792 Long term (current) use of antibiotics: Secondary | ICD-10-CM | POA: Insufficient documentation

## 2014-01-03 DIAGNOSIS — S99911A Unspecified injury of right ankle, initial encounter: Secondary | ICD-10-CM | POA: Insufficient documentation

## 2014-01-03 DIAGNOSIS — S9781XA Crushing injury of right foot, initial encounter: Secondary | ICD-10-CM

## 2014-01-03 DIAGNOSIS — M79671 Pain in right foot: Secondary | ICD-10-CM

## 2014-01-03 DIAGNOSIS — Y998 Other external cause status: Secondary | ICD-10-CM | POA: Insufficient documentation

## 2014-01-03 DIAGNOSIS — S9031XA Contusion of right foot, initial encounter: Secondary | ICD-10-CM | POA: Insufficient documentation

## 2014-01-03 DIAGNOSIS — Y92410 Unspecified street and highway as the place of occurrence of the external cause: Secondary | ICD-10-CM | POA: Insufficient documentation

## 2014-01-03 NOTE — Discharge Instructions (Signed)
Please call your doctor for a followup appointment within 24-48 hours. When you talk to your doctor please let them know that you were seen in the emergency department and have them acquire all of your records so that they can discuss the findings with you and formulate a treatment plan to fully care for your new and ongoing problems. Please call and set up an appointment with health and wellness Center Please call and set up an appointment with orthopedics to be reassessed regarding injury Please keep foot in cam walker boot and use crutches for walking Please rest, ice, elevate-toes above nose to prevent compartment syndrome Please avoid any physical or strenuous activity Please continue to monitor symptoms closely and if symptoms are to worsen or change (fever greater than 101, chills, sweating, nausea, vomiting, chest pain, shortness of breathe, difficulty breathing, weakness, numbness, tingling, worsening or changes to pain pattern, fall, injury, changes to skin colored, swelling, pain with motion to the toes, pain out of proportion, complete loss of sensation) please report back to the Emergency Department immediately.  Arthralgia Your caregiver has diagnosed you as suffering from an arthralgia. Arthralgia means there is pain in a joint. This can come from many reasons including:  Bruising the joint which causes soreness (inflammation) in the joint.  Wear and tear on the joints which occur as we grow older (osteoarthritis).  Overusing the joint.  Various forms of arthritis.  Infections of the joint. Regardless of the cause of pain in your joint, most of these different pains respond to anti-inflammatory drugs and rest. The exception to this is when a joint is infected, and these cases are treated with antibiotics, if it is a bacterial infection. HOME CARE INSTRUCTIONS   Rest the injured area for as long as directed by your caregiver. Then slowly start using the joint as directed by your  caregiver and as the pain allows. Crutches as directed may be useful if the ankles, knees or hips are involved. If the knee was splinted or casted, continue use and care as directed. If an stretchy or elastic wrapping bandage has been applied today, it should be removed and re-applied every 3 to 4 hours. It should not be applied tightly, but firmly enough to keep swelling down. Watch toes and feet for swelling, bluish discoloration, coldness, numbness or excessive pain. If any of these problems (symptoms) occur, remove the ace bandage and re-apply more loosely. If these symptoms persist, contact your caregiver or return to this location.  For the first 24 hours, keep the injured extremity elevated on pillows while lying down.  Apply ice for 15-20 minutes to the sore joint every couple hours while awake for the first half day. Then 03-04 times per day for the first 48 hours. Put the ice in a plastic bag and place a towel between the bag of ice and your skin.  Wear any splinting, casting, elastic bandage applications, or slings as instructed.  Only take over-the-counter or prescription medicines for pain, discomfort, or fever as directed by your caregiver. Do not use aspirin immediately after the injury unless instructed by your physician. Aspirin can cause increased bleeding and bruising of the tissues.  If you were given crutches, continue to use them as instructed and do not resume weight bearing on the sore joint until instructed. Persistent pain and inability to use the sore joint as directed for more than 2 to 3 days are warning signs indicating that you should see a caregiver for a follow-up visit  as soon as possible. Initially, a hairline fracture (break in bone) may not be evident on X-rays. Persistent pain and swelling indicate that further evaluation, non-weight bearing or use of the joint (use of crutches or slings as instructed), or further X-rays are indicated. X-rays may sometimes not show a  small fracture until a week or 10 days later. Make a follow-up appointment with your own caregiver or one to whom we have referred you. A radiologist (specialist in reading X-rays) may read your X-rays. Make sure you know how you are to obtain your X-ray results. Do not assume everything is normal if you do not hear from us. SEEK MEDICAL CARE IF: Bruising, swelling, or pain increases. SEEK IMMEDIATE MEDICAL CARE IF:   Your fingers or toes are numb or blue.  The pain is not responding to medications and continues to stay the same or get worse.  The pain in your joint becomes severe.  You develop a fever over 102 F (38.9 C).  It becomes impossible to move or use the joint. MAKE SURE YOU:   Understand these instructions.  Will watch your condition.  Will get help right away if you are not doing well or get worse. Document Released: 01/04/2005 Document Revised: 03/29/2011 Document Reviewed: 08/23/2007 [facility] Patient Information 2015 [place], [place]. This information is not intended to replace advice given to you by your health care provider. Make sure you discuss any questions you have with your health care provider.   Emergency Department Resource Guide 1) Find a Doctor and Pay Out of Pocket Although you won't have to find out who is covered by your insurance plan, it is a good idea to ask around and get recommendations. You will then need to call the office and see if the doctor you have chosen will accept you as a new patient and what types of options they offer for patients who are self-pay. Some doctors offer discounts or will set up payment plans for their patients who do not have insurance, but you will need to ask so you aren't surprised when you get to your appointment.  2) Contact Your Local Health Department Not all health departments have doctors that can see patients for sick visits, but many do, so it is worth a call to see if yours does. If you don't know where your local  health department is, you can check in your phone book. The CDC also has a tool to help you locate your state's health department, and many state websites also have listings of all of their local health departments.  3) Find a Walk-in Clinic If your illness is not likely to be very severe or complicated, you may want to try a walk in clinic. These are popping up all over the country in pharmacies, drugstores, and shopping centers. They're usually staffed by nurse practitioners or physician assistants that have been trained to treat common illnesses and complaints. They're usually fairly quick and inexpensive. However, if you have serious medical issues or chronic medical problems, these are probably not your best option.  No Primary Care Doctor: - Call Health Connect at  [phone] - they can help you locate a primary care doctor that  accepts your insurance, provides certain services, etc. - Physician Referral Service- [phone]  Chronic Pain Problems: Organization         Address  Phone   Notes  [facility]  [phone] Patients need to be referred by their primary care doctor.  Medication Assistance: Organization         Address  Phone   Notes  [facility] Medication [facility] [address] [place], [place] [address] [phone] --Must be a resident of [facility] -- Must have NO insurance coverage whatsoever (no Medicaid/ Medicare, etc.) -- The pt. MUST have a primary care doctor that directs their care regularly and follows them in the community   [facility]ist  [phone]   [employer]  [phone]    Agencies that provide inexpensive medical care: Organization         Address  Phone   Notes  [place]  [phone]   [facility]    [phone]   [facility] [place], [place] [address] [phone]   [place] [address], [place] [phone]   Planned Parenthood    [phone]   [facility]    [phone]   [place] and [place] [address], [place] Phone:  [phone], Fax:  [phone] Hours of Operation:  9 am - 6 pm, M-F.  Also accepts Medicaid/Medicare and self-pay.  [facility] for [place], [address], [place] Phone: [phone], Fax: [phone]. Hours of Operation:  8:30 am - 5:30 pm, M-F.  Also accepts Medicaid and self-pay.  [facility] [address] [place] Phone: [phone]   [place], [place], [place] [phone], Ext. [phone] Mondays & Thursdays: 7-9 AM.  First 15 patients are seen on a first come, first serve basis.    [place] Providers:  Organization         Address  Phone   Notes  [facility] [address], [place] [phone] Also accepts self-pay patients.  [facility] [address], [place]  [phone]   [place], [address], [place] [phone]   [facility] [address], [place] [phone]   Lucianne Lei [address], [place]   [phone] Only accepts [place] Access [place] patients after they have their name applied to their card.   Self-Pay (no insurance) in [facility]:  Organization         Address  Phone   Notes  Sickle Cell Patients, [facility] [place] [phone]   [facility] [place] [phone]   [facility] [place]  [place], [place], [place] [phone]   [facility]/Dr. Osei-Bonsu  [address], [place] or [address], [place] [phone] Phone number for both [place] and  [place] locations is the same.  Urgent Medical and [facility] [address], [place] [phone]   [facility] [address] [place] or [address] [phone]   [facility] [address] [place] [phone], phone; [phone], fax Sees patients 1st and 3rd Saturday of every month.  Must not qualify for public or private insurance (i.e. Medicaid, Medicare, [place] Health Choice, Veterans' Benefits)  Household income should be no more than 200% of the poverty level The  clinic cannot treat you if you are pregnant or think you are pregnant  Sexually transmitted diseases are not treated at the clinic.    Dental Care: Organization         Address  Phone  Notes  [facility] [phone] Accepts children up to age 41 who are enrolled in [place] or [place]; pregnant women with a Medicaid card; and children who have applied for Medicaid or [place] Health Choice, but were declined, whose parents can pay a reduced fee at time of service.  [facility] Department of [facility]  [address], [place] [phone] Accepts children up to age 37 who are enrolled in [place] or [place]; pregnant women with a Medicaid card; and children who have applied for Medicaid or  Health Choice, but were declined, whose parents can pay a reduced fee at time of service.  [place] Adult Dental Access PROGRAM  [place] [phone] Patients are seen by appointment only. Walk-ins are not accepted. [place] will see patients 32 years of age and older. Monday - Tuesday (8am-5pm) Most Wednesdays (8:30-5pm) $30 per visit, cash only  [facility] Adult [facility] Access PROGRAM  [address], [facility] [phone] Patients are seen by appointment only. Walk-ins are not accepted.  [place] will see patients 17 years of age and older. One Wednesday Evening (Monthly: Volunteer Based).  $30 per visit, cash only  [place]  [phone] for adults; Children under age 30, call Graduate Pediatric Dentistry at [phone]. Children aged 49-14, please call [phone] to request a pediatric application.  Dental services are provided in all areas of dental care including fillings, crowns and bridges, complete and partial dentures, implants, gum treatment, root canals, and extractions. Preventive care is also provided. Treatment is provided to both adults and children. Patients are selected via a lottery and there is often a waiting list.   [facility] [address] [place]  [phone] [email or web address]   [facility] [address] [place], [place] [phone], Ext. [phone] Second and Fourth Thursday of each month, opens at 6:30 AM; Clinic ends at 9 AM.  Patients are seen on a first-come first-served basis, and a limited number are seen during each clinic.   [facility]  [address] Hillard Danker [place], [place] [phone]   Eligibility Requirements You must have lived in [place], [place], or [place] counties for at least the last three months.   You cannot be eligible for state or federal sponsored [employer], including [employer], [place], or [employer].   You generally cannot be eligible for healthcare insurance through your employer.    How to apply: Eligibility screenings are held every Tuesday and Wednesday afternoon from 1:00 pm until 4:00 pm. You do not need an appointment for the interview!  [facility] [address], [place], [place]   [place]  [place] Department  [place]  [phone]    Behavioral Health Resources in the  Community: Intensive Outpatient Programs Organization         Address  Phone  Notes  [place]. [address] [place], [place] [phone]   [facility] [address] [place] [employer],  [place] [phone]   ADS: Alcohol & Drug Svcs [address] [place], [place]   [place]ta [place] [address],  [place], [place] or [phone]   Substance Abuse Resources Organization         Address  Phone  Notes  Alcohol and Drug Services  3015454660   [place]  [phone]   [facility] [place]   Chinita Pester  9406975812   Residential & Outpatient Substance Abuse Program  [phone]   Psychological Services Organization         Address  Phone  Notes  [facility] [place]  [place]  [phone]   [place] [address] [place] or [phone]    Mobile Crisis Teams Organization         Address  Phone  Notes  [facility], Mobile Crisis Care Unit  [phone]   [facility]  [address] [place], [place]   [place] [address], [place] [phone]    Self-Help/Support Groups Organization         Address  Phone             Notes  [place]. of [place] - variety of support groups  [place] Call for more information  Narcotics Anonymous (NA), [facility] [address], [employer] [place]  2 meetings at this location   Special educational needs teacher         Address  Phone  Notes  [facility]AP Residential Treatment [place],    [place]  [phone]   [facility]  [address] [place] [address], [place], [place]   [place], [place] [phone] Admissions: 8am-3pm M-F  Incentives Substance [place] [address],    [place], [place] [phone]   [facility] [address] [place], [place], [place]   [facility] [address],  [place], [place]   [facility] - Intensive Outpatient [address] Kristeen [address], [place], [place]   [facility].,  [place], [place] [phone] or [phone]   [facility] Services (RTS) [address], [place], [place] Accepts Medicaid  [facility] [place] [address]  [place] [phone] Substance Abuse/Addiction Treatment   [facility] Organization         Address  Phone  Notes  [facility]  [phone]   Domenic Schwab, PhD [address] Arlis Porta [place], [place]   [phone] or [phone]   [place], [place] [phone]   [facility] [address] [place], [place] [phone] Insurance/Medicaid/sponsorship through [facility] [address], [address] [place]                                    [place], [place] [phone] [place] [address], [place] [phone]    Dr. Adele Schilder  [phone]   [facility] Dept. 1) [address], [place] 2) [place] 3)  [address], [place] [phone]  (  [phone]   °[place] Child Abuse Hotline (336) 342-1394 or (336) [phone] (After Hours)    ° ° ° °

## 2014-01-03 NOTE — ED Notes (Signed)
Ortho tech at bedside 
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
## 2015-02-16 ENCOUNTER — Emergency Department (HOSPITAL_COMMUNITY): Payer: Self-pay

## 2015-02-16 ENCOUNTER — Emergency Department (HOSPITAL_COMMUNITY)
Admission: EM | Admit: 2015-02-16 | Discharge: 2015-02-16 | Disposition: A | Discharge status: Home / Self Care | Payer: Self-pay | Attending: Emergency Medicine | Admitting: Emergency Medicine

## 2015-02-16 ENCOUNTER — Encounter (HOSPITAL_COMMUNITY): Payer: Self-pay | Admitting: *Deleted

## 2015-02-16 DIAGNOSIS — Y9289 Other specified places as the place of occurrence of the external cause: Secondary | ICD-10-CM | POA: Insufficient documentation

## 2015-02-16 DIAGNOSIS — S022XXA Fracture of nasal bones, initial encounter for closed fracture: Secondary | ICD-10-CM

## 2015-02-16 DIAGNOSIS — Z7952 Long term (current) use of systemic steroids: Secondary | ICD-10-CM | POA: Insufficient documentation

## 2015-02-16 DIAGNOSIS — S0083XA Contusion of other part of head, initial encounter: Secondary | ICD-10-CM | POA: Insufficient documentation

## 2015-02-16 DIAGNOSIS — S0091XA Abrasion of unspecified part of head, initial encounter: Secondary | ICD-10-CM | POA: Insufficient documentation

## 2015-02-16 DIAGNOSIS — F17210 Nicotine dependence, cigarettes, uncomplicated: Secondary | ICD-10-CM | POA: Insufficient documentation

## 2015-02-16 DIAGNOSIS — Y999 Unspecified external cause status: Secondary | ICD-10-CM | POA: Insufficient documentation

## 2015-02-16 DIAGNOSIS — Z792 Long term (current) use of antibiotics: Secondary | ICD-10-CM | POA: Insufficient documentation

## 2015-02-16 DIAGNOSIS — Z23 Encounter for immunization: Secondary | ICD-10-CM | POA: Insufficient documentation

## 2015-02-16 DIAGNOSIS — Y9389 Activity, other specified: Secondary | ICD-10-CM | POA: Insufficient documentation

## 2015-02-16 DIAGNOSIS — Z79899 Other long term (current) drug therapy: Secondary | ICD-10-CM | POA: Insufficient documentation

## 2015-02-16 MED ORDER — IBUPROFEN 800 MG PO TABS: 800.0000 mg | ORAL_TABLET | Freq: Three times a day (TID) | ORAL | Status: AC

## 2015-02-16 MED ORDER — IBUPROFEN 400 MG PO TABS
800.0000 mg | ORAL_TABLET | Freq: Once | ORAL | Status: AC
  Administered 2015-02-16: 800 mg via ORAL
  Filled 2015-02-16: qty 2

## 2015-02-16 MED ORDER — HYDROCODONE-ACETAMINOPHEN 5-325 MG PO TABS: 1.0000 | ORAL_TABLET | ORAL | Status: DC | PRN

## 2015-02-16 MED ORDER — IBUPROFEN 800 MG PO TABS: 800.0000 mg | ORAL_TABLET | Freq: Three times a day (TID) | ORAL | Status: DC

## 2015-02-16 MED ORDER — TETANUS-DIPHTH-ACELL PERTUSSIS 5-2.5-18.5 LF-MCG/0.5 IM SUSP
0.5000 mL | Freq: Once | INTRAMUSCULAR | Status: AC
  Administered 2015-02-16: 0.5 mL via INTRAMUSCULAR
  Filled 2015-02-16: qty 0.5

## 2015-02-16 MED ORDER — PROPARACAINE HCL 0.5 % OP SOLN
1.0000 [drp] | Freq: Once | OPHTHALMIC | Status: AC
  Administered 2015-02-16: 1 [drp] via OPHTHALMIC
  Filled 2015-02-16: qty 15

## 2015-02-16 MED ORDER — FLUORESCEIN SODIUM 1 MG OP STRP
1.0000 | ORAL_STRIP | Freq: Once | OPHTHALMIC | Status: AC
  Administered 2015-02-16: 1 via OPHTHALMIC
  Filled 2015-02-16: qty 1

## 2015-02-16 NOTE — ED Notes (Signed)
Patient states that he wears glasses so he could not see the eye chart. He got to the second line with his left eye and he said he couldn't see with his right eye,\.
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
## 2015-02-16 NOTE — ED Notes (Signed)
PT and family updated on wait time for CT .
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
## 2015-02-16 NOTE — ED Notes (Signed)
Declined W/C at D/C and was escorted to lobby by RN. 

## 2019-08-05 ENCOUNTER — Emergency Department (HOSPITAL_COMMUNITY)
Admission: EM | Admit: 2019-08-05 | Discharge: 2019-08-05 | Disposition: A | Discharge status: Home / Self Care | Payer: Self-pay | Attending: Emergency Medicine | Admitting: Emergency Medicine

## 2019-08-05 ENCOUNTER — Encounter (HOSPITAL_COMMUNITY): Payer: Self-pay | Admitting: Emergency Medicine

## 2019-08-05 ENCOUNTER — Other Ambulatory Visit: Payer: Self-pay

## 2019-08-05 ENCOUNTER — Emergency Department (HOSPITAL_COMMUNITY): Payer: Self-pay

## 2019-08-05 DIAGNOSIS — W109XXA Fall (on) (from) unspecified stairs and steps, initial encounter: Secondary | ICD-10-CM | POA: Insufficient documentation

## 2019-08-05 DIAGNOSIS — Y929 Unspecified place or not applicable: Secondary | ICD-10-CM | POA: Insufficient documentation

## 2019-08-05 DIAGNOSIS — F17210 Nicotine dependence, cigarettes, uncomplicated: Secondary | ICD-10-CM | POA: Insufficient documentation

## 2019-08-05 DIAGNOSIS — Y9389 Activity, other specified: Secondary | ICD-10-CM | POA: Insufficient documentation

## 2019-08-05 DIAGNOSIS — Y999 Unspecified external cause status: Secondary | ICD-10-CM | POA: Insufficient documentation

## 2019-08-05 DIAGNOSIS — S62326A Displaced fracture of shaft of fifth metacarpal bone, right hand, initial encounter for closed fracture: Secondary | ICD-10-CM | POA: Insufficient documentation

## 2019-08-05 MED ORDER — HYDROCODONE-ACETAMINOPHEN 5-325 MG PO TABS: 2.0000 | ORAL_TABLET | Freq: Once | ORAL | Status: DC

## 2019-08-05 MED ORDER — IBUPROFEN 400 MG PO TABS
600.0000 mg | ORAL_TABLET | Freq: Once | ORAL | Status: DC
  Filled 2019-08-05: qty 1

## 2019-08-05 MED ORDER — HYDROCODONE-ACETAMINOPHEN 5-325 MG PO TABS: 1.0000 | ORAL_TABLET | Freq: Four times a day (QID) | ORAL | 0 refills | Status: AC | PRN

## 2019-08-05 NOTE — ED Notes (Signed)
Pt verbalized understanding of discharge instructions. Follow up care, prescriptions, and pain management reviewed. Pt had no further questions, refused pain medication, and ambulated independently to lobby.

## 2019-08-05 NOTE — ED Provider Notes (Signed)
[facility] EMERGENCY DEPARTMENT Provider Note   CSN: [id number] Arrival date & time: 08/05/19  1442     History Chief Complaint  Patient presents with  . Hand Injury    Melvin Miller is a 29 y.o. male.  Patient is a 29 year old male with no significant past medical history.  He tells me he fell down steps 2 nights ago and injured his right hand.  He denies other injury.  His hand has been swelling and becoming more painful since.  He describes numbness to his right fifth finger.  The history is provided by the patient.  Hand Injury Location:  Hand Hand location:  R hand Injury: yes   Mechanism of injury: fall   Pain details:    Quality:  Throbbing   Radiates to:  Does not radiate   Onset quality:  Sudden   Timing:  Constant   Progression:  Worsening      History reviewed. No pertinent past medical history.  There are no problems to display for this patient.   Past Surgical History:  Procedure Laterality Date  . HERNIA REPAIR         Family History  Problem Relation Age of Onset  . Hypothyroidism Mother     Social History   Tobacco Use  . Smoking status: Current Every Day Smoker    Packs/day: 0.50    Years: 2.00    Pack years: 1.00    Types: Cigarettes  . Smokeless tobacco: Never Used  Vaping Use  . Vaping Use: Every day  Substance Use Topics  . Alcohol use: Yes    Alcohol/week: 2.0 standard drinks    Types: 2 Cans of beer per week  . Drug use: No    Home Medications Prior to Admission medications   Medication Sig Start Date End Date Taking? Authorizing Provider  clindamycin (CLEOCIN) 150 MG capsule Take 3 capsules (450 mg total) by mouth 3 (three) times daily. 10/30/13   Mellody Drown, PA-C  diphenhydrAMINE (BENADRYL) 25 MG tablet Take 1 tablet (25 mg total) by mouth every 6 (six) hours. 10/30/13   Mellody Drown, PA-C  HYDROcodone-acetaminophen (NORCO/VICODIN) 5-325 MG tablet Take 1 tablet by mouth every 4 (four) hours as  needed. 02/16/15   Kirichenko, Lemont Fillers, PA-C  hydrocortisone cream 1 % Apply to affected area 2 times daily. Do not apply to your face. 10/30/13   Mellody Drown, PA-C  ibuprofen (ADVIL,MOTRIN) 800 MG tablet Take 1 tablet (800 mg total) by mouth 3 (three) times daily. 02/16/15   Kirichenko, Tatyana, PA-C  predniSONE (DELTASONE) 20 MG tablet Take 2 tablets (40 mg total) by mouth daily. Take 40 mg by mouth daily for 3 days, then 20mg  by mouth daily for 3 days, then 10mg  daily for 3 days 10/30/13   , PA-C    Allergies    Patient has no known allergies.  Review of Systems   Review of Systems  All other systems reviewed and are negative.   Physical Exam Updated Vital Signs BP (!) 142/89   Pulse 88   Temp 99.1 F (37.3 C) (Oral)   Resp 17   SpO2 100%   Physical Exam Vitals and nursing note reviewed.  Constitutional:      General: He is not in acute distress.    Appearance: Normal appearance. He is not ill-appearing.  HENT:     Head: Normocephalic and atraumatic.  Pulmonary:     Effort: Pulmonary effort is normal.  Musculoskeletal:  Comments: The right hand has swelling and tenderness over the fifth metacarpal.  He has limited range of motion of the fifth finger, however capillary refill is brisk and sensation is intact of the tip.  Skin:    General: Skin is warm and dry.  Neurological:     Mental Status: He is alert.     ED Results / Procedures / Treatments   Labs (all labs ordered are listed, but only abnormal results are displayed) Labs Reviewed - No data to display  EKG None  Radiology DG Hand Complete Right  Result Date: 08/05/2019 CLINICAL DATA:  Pt fell 2 days ago and has pain on the 5th [facility]. There is swelling present on lateral/posterior side of hand. Pt is unable to straighten his fingers. EXAM: RIGHT HAND - COMPLETE 3+ VIEW COMPARISON:  None. FINDINGS: There is a displaced and angulated fracture of the mid shaft of the fifth metatarsal.  Displacement is greater than a shaft width. No evidence of dislocation. No other acute osseous abnormality in the right hand. Regional soft tissues are unremarkable. IMPRESSION: Displaced and angulated fracture of the mid shaft of the fifth metatarsal in the right hand. Electronically Signed   By: Emmaline Kluver M.D.   On: 08/05/2019 15:39    Procedures Procedures (including critical care time)  Medications Ordered in ED Medications  HYDROcodone-acetaminophen (NORCO/VICODIN) 5-325 MG per tablet 2 tablet (has no administration in time range)    ED Course  I have reviewed the triage vital signs and the nursing notes.  Pertinent labs & imaging results that were available during my care of the patient were reviewed by me and considered in my medical decision making (see chart for details).    MDM Rules/Calculators/A&P  Patient presenting here with a hand injury.  X-rays show 1/5 metacarpal fracture that is angulated and displaced.  Patient will be placed in an ulnar gutter splint and will be referred to hand surgery.  Final Clinical Impression(s) / ED Diagnoses Final diagnoses:  None    Rx / DC Orders ED Discharge Orders    None       Geoffery Lyons, MD 08/05/19 1851

## 2019-08-05 NOTE — ED Triage Notes (Signed)
Pt. Stated, I fell down and landed on my rt. Hand.

## 2019-08-05 NOTE — ED Notes (Signed)
Ortho at bedside.

## 2019-08-05 NOTE — Progress Notes (Signed)
Orthopedic Tech Progress Note Patient Details:  Melvin Miller Dec 12, 1990 [id number]  Ortho Devices Type of Ortho Device: Ulna gutter splint, Arm sling Ortho Device/Splint Location: RUE Ortho Device/Splint Interventions: Application, Adjustment   Post Interventions Patient Tolerated: Well Instructions Provided: Care of device, Adjustment of device    Melvin Miller E [name] 08/05/2019, 7:46 PM

## 2019-08-05 NOTE — Discharge Instructions (Addendum)
Wear splint as applied until followed up by orthopedics.  Ice for 20 minutes every 2 hours while awake for the next 2 days.  Take hydrocodone as prescribed as needed for pain.  You are to follow-up with orthopedic surgery in the next 1 to 2 days.  The contact information for Dr. Eulah Pont has been provided in this discharge summary for you to call and make these arrangements.

## 2021-02-06 IMAGING — DX DG HAND COMPLETE 3+V*R*
3 series · 3 of 3 positions shown · non-contrast
Comparison: None.

CLINICAL DATA: Pt fell 2 days ago and has pain on the 5th [facility]. There
is swelling present on lateral/posterior side of hand. Pt is unable
to straighten his fingers.

EXAM:
RIGHT HAND - COMPLETE 3+ VIEW

[hand pa]
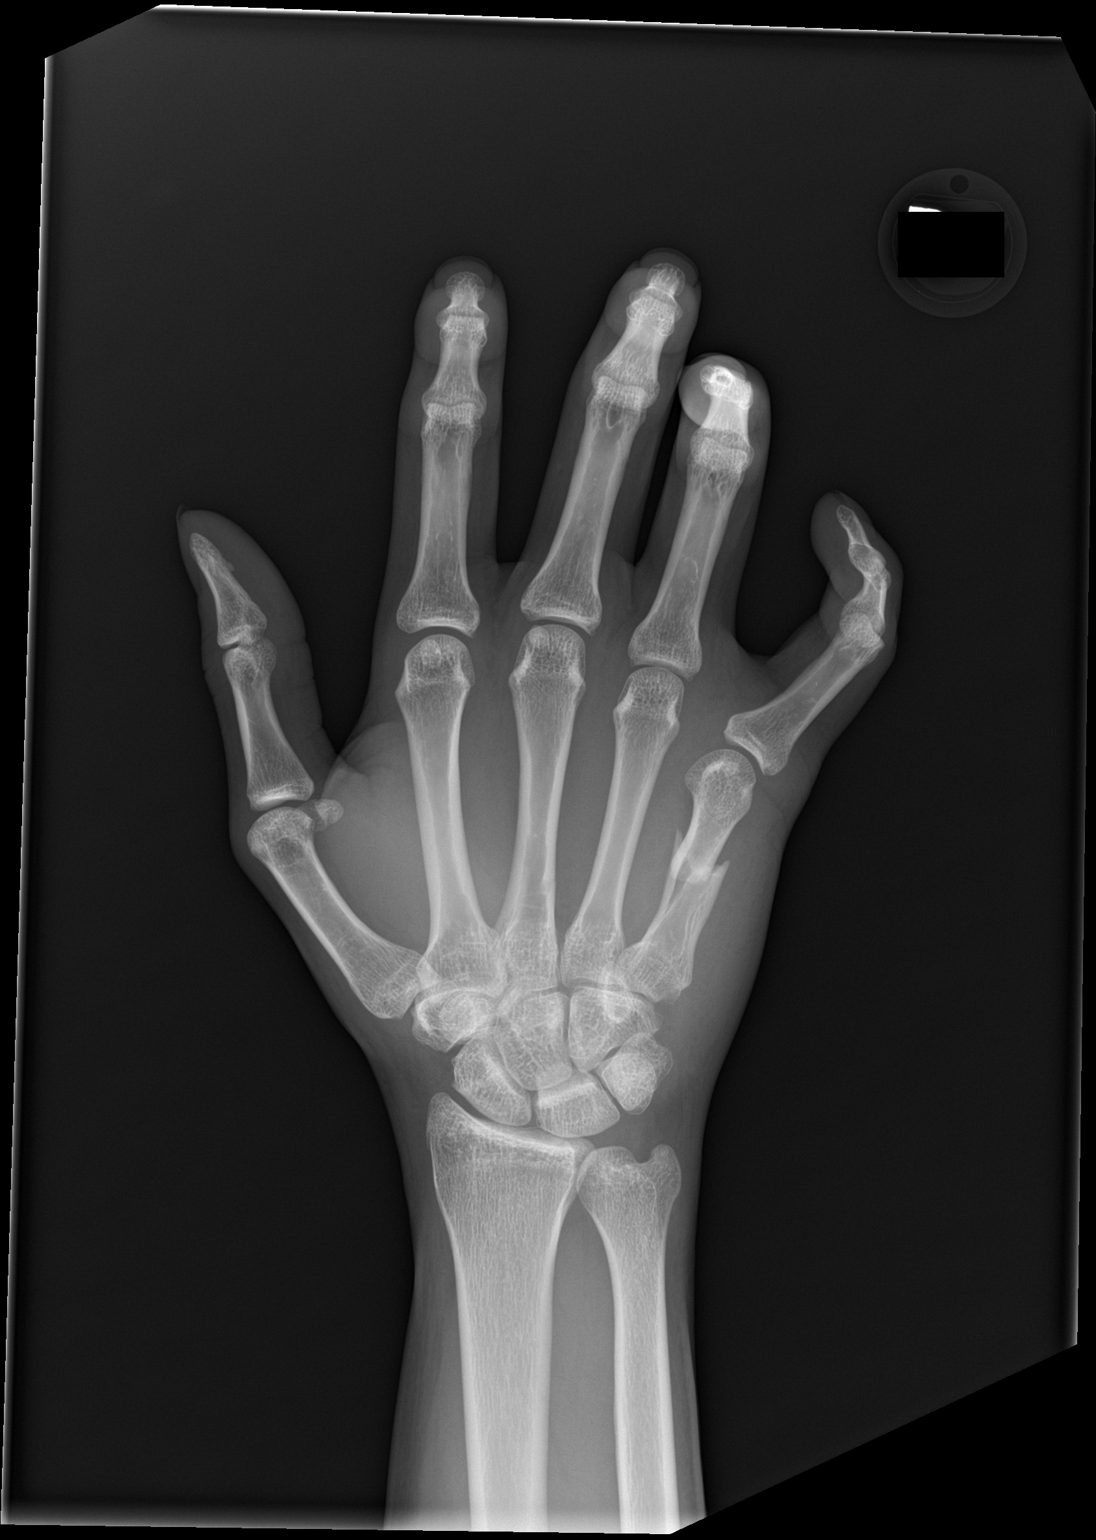

[hand obl]
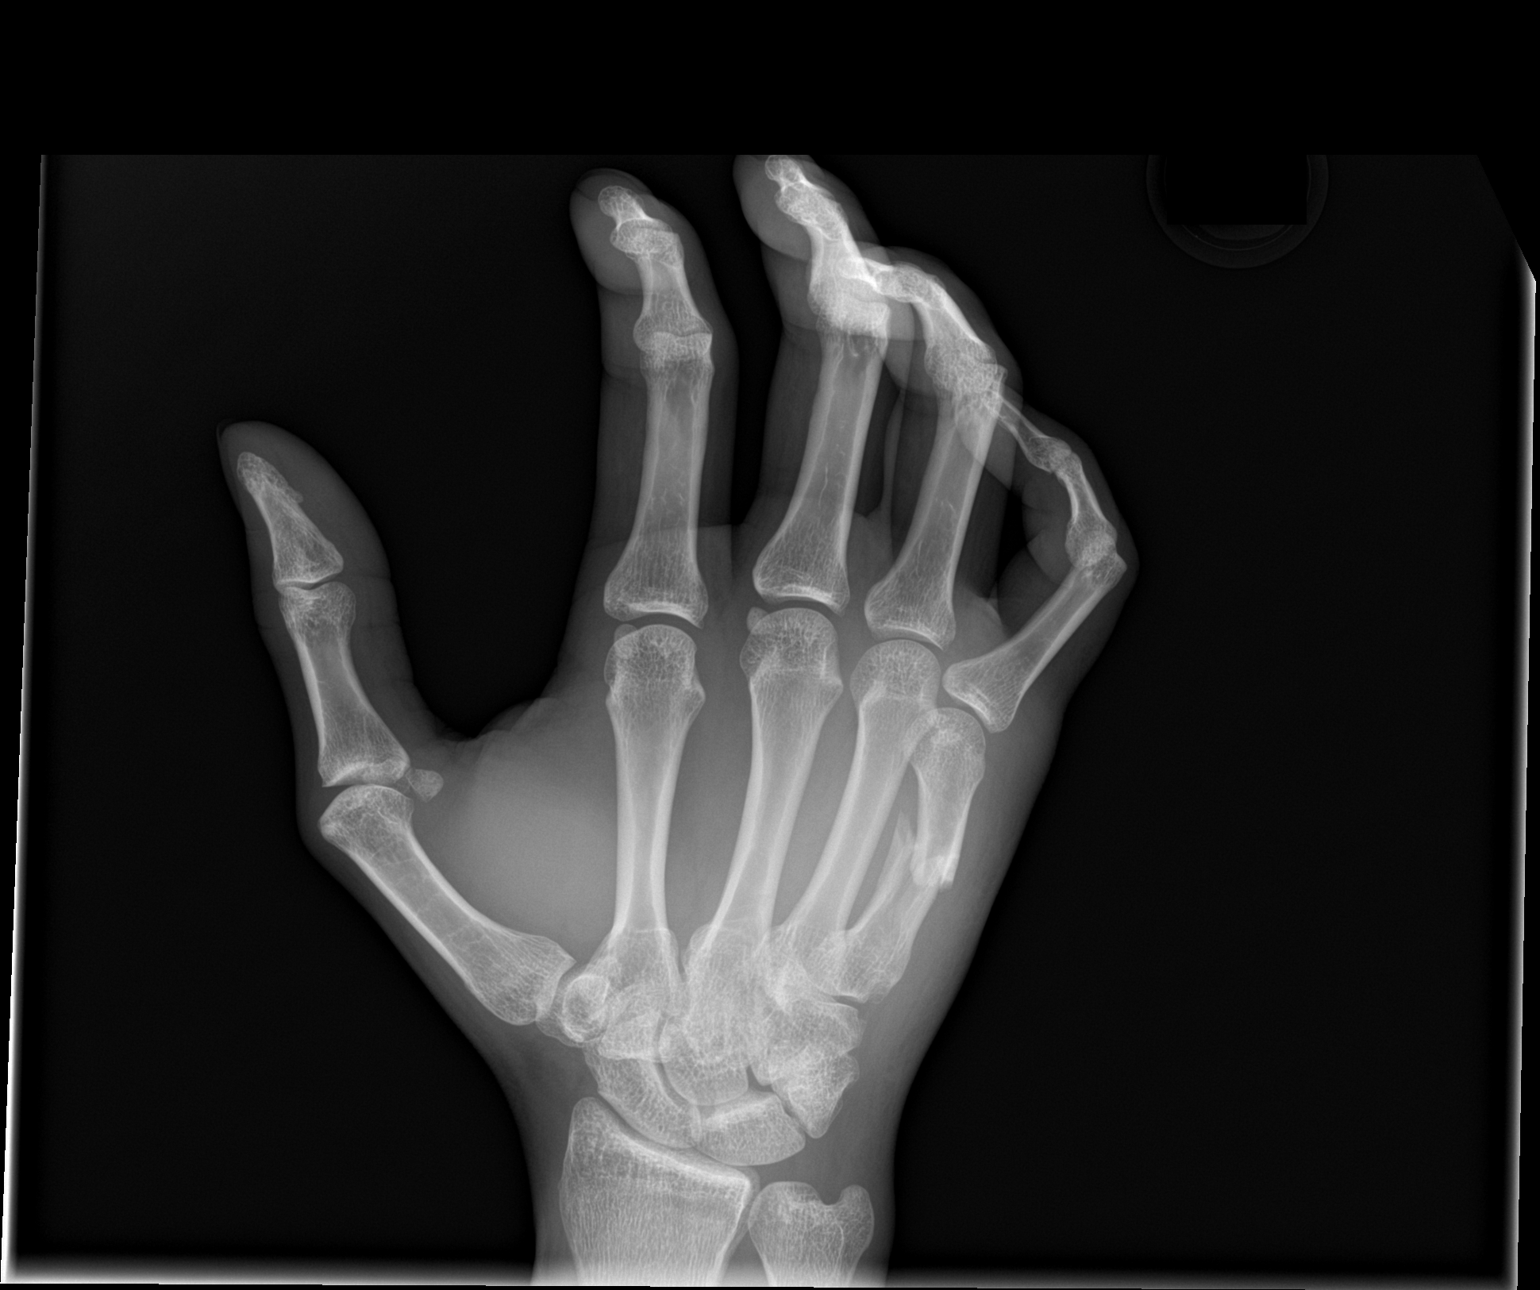

[hand lat]
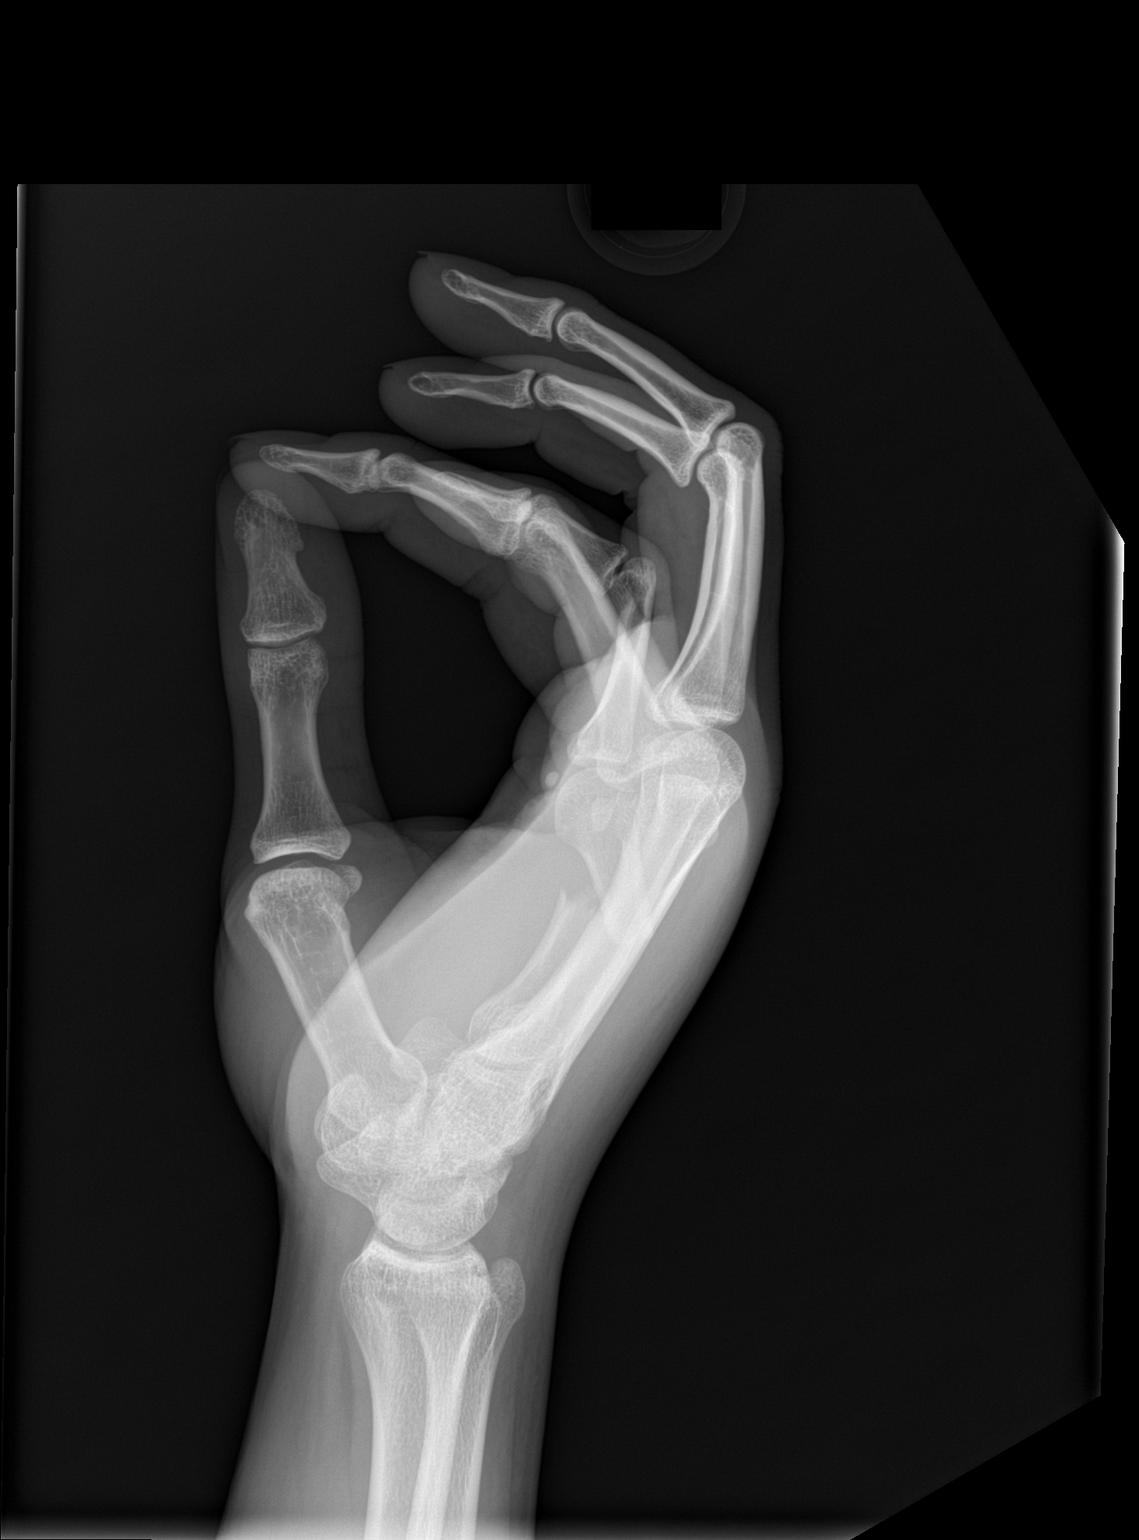

[3 of 3 positions shown; findings below may reference images not displayed]

FINDINGS: There is a displaced and angulated fracture of the mid shaft of the
fifth metatarsal. Displacement is greater than a shaft width. No
evidence of dislocation. No other acute osseous abnormality in the
right hand. Regional soft tissues are unremarkable.
IMPRESSION: Displaced and angulated fracture of the mid shaft of the fifth
metatarsal in the right hand.
